# Patient Record
Sex: Female | Born: 1985 | Race: Black or African American | Hispanic: No | Marital: Single | Smoking: Never smoker
Health system: Southern US, Community
[De-identification: ages and names within clinical notes are randomized; demographics above are authoritative.]

## PROBLEM LIST (undated history)

## (undated) ENCOUNTER — Inpatient Hospital Stay (HOSPITAL_COMMUNITY): Payer: Self-pay

## (undated) DIAGNOSIS — Z789 Other specified health status: Secondary | ICD-10-CM

## (undated) HISTORY — PX: DILATION AND CURETTAGE OF UTERUS: SHX78

---

## 2011-11-20 ENCOUNTER — Encounter (HOSPITAL_COMMUNITY): Payer: Self-pay | Admitting: *Deleted

## 2011-11-20 ENCOUNTER — Inpatient Hospital Stay (HOSPITAL_COMMUNITY)
Admission: AD | Admit: 2011-11-20 | Discharge: 2011-11-20 | Disposition: A | Discharge status: Home / Self Care | Payer: Medicaid Other | Source: Ambulatory Visit | Attending: Obstetrics & Gynecology | Admitting: Obstetrics & Gynecology

## 2011-11-20 ENCOUNTER — Inpatient Hospital Stay (HOSPITAL_COMMUNITY): Payer: Medicaid Other

## 2011-11-20 DIAGNOSIS — O209 Hemorrhage in early pregnancy, unspecified: Secondary | ICD-10-CM

## 2011-11-20 HISTORY — DX: Other specified health status: Z78.9

## 2011-11-20 LAB — DIFFERENTIAL
Lymphs Abs: 3.2 10*3/uL (ref 0.7–4.0)
Monocytes Relative: 9 % (ref 3–12)
Neutro Abs: 8.1 10*3/uL — ABNORMAL HIGH (ref 1.7–7.7)
Neutrophils Relative %: 63 % (ref 43–77)

## 2011-11-20 LAB — SAMPLE TO BLOOD BANK

## 2011-11-20 LAB — CBC
Hemoglobin: 11.1 g/dL — ABNORMAL LOW (ref 12.0–15.0)
Platelets: 308 10*3/uL (ref 150–400)
RBC: 4.15 MIL/uL (ref 3.87–5.11)
WBC: 12.7 10*3/uL — ABNORMAL HIGH (ref 4.0–10.5)

## 2011-11-20 NOTE — Discharge Instructions (Signed)
Pelvic Rest Pelvic rest is sometimes recommended for women when:   The placenta is partially or completely covering the opening of the cervix (placenta previa).   There is bleeding between the uterine wall and the amniotic sac in the first trimester (subchorionic hemorrhage).   The cervix begins to open without labor starting (incompetent cervix, cervical insufficiency).   The labor is too early (preterm labor).  HOME CARE INSTRUCTIONS  Do not have sexual intercourse, stimulation, or an orgasm.   Do not use tampons, douche, or put anything in the vagina.   Do not lift anything over 10 pounds (4.5 kg).   Avoid strenuous activity or straining your pelvic muscles.  SEEK MEDICAL CARE IF:  You have any vaginal bleeding during pregnancy. Treat this as a potential emergency.   You have cramping pain felt low in the stomach (stronger than menstrual cramps).   You notice vaginal discharge (watery, mucus, or bloody).   You have a low, dull backache.   There are regular contractions or uterine tightening.  SEEK IMMEDIATE MEDICAL CARE IF: You have vaginal bleeding and have placenta previa.  Document Released: 10/20/2010 Document Revised: 06/14/2011 Document Reviewed: 10/20/2010 ExitCare Patient Information 2012 [employer].Vaginal Bleeding During Pregnancy A small amount of bleeding from the vagina can happen anytime during pregnancy. Be sure to tell your doctor about all vaginal bleeding.  HOME CARE  Get plenty of rest and sleep.   Count the number of pads you use each day. Do not use tampons.   Save any tissue you pass for your doctor to see.   Do not exercise   Do not do any heavy lifting.   Avoid going up and down stairs. If you must climb stairs, go slowly.   Do not have sex (intercourse) or orgasms until approved by your doctor.   Do not douche.   Only take medicine as told by your doctor. Do not take aspirin.   Eat healthy.   Always keep your follow-up  appointments.  GET HELP RIGHT AWAY IF:   You feel the baby moving less or not moving at all.   The bleeding gets worse.   You have very painful cramps or pain in your stomach or back.   You pass large clots or anything that looks like tissue.   You have a temperature by mouth above 102 F (38.9 C).   You feel very weak.   You have chills.   You feel dizzy or pass out (faint).   You have a gush of fluid from the vagina.  MAKE SURE YOU:   Understand these instructions.   Will watch your condition.   Will get help right away if you are not doing well or get worse.  Document Released: 04/03/2008 Document Revised: 06/14/2011 Document Reviewed: 05/31/2009 ExitCare Patient Information 2012 [employer]. 

## 2011-11-20 NOTE — MAU Provider Note (Signed)
History     CSN: [id number]  Arrival date and time: 11/20/11 0272   First Provider Initiated Contact with Patient 11/20/11 1940      Chief Complaint  Patient presents with  . Vaginal Bleeding   HPIJessica Travis is 26 y.o. G1P0 [redacted]w[redacted]d weeks presenting with sudden onset of bleeding while she was sitting at school (in [place]). She was brought in by EMS.   Denies pain.  + fetal heart tones.  Had ultrasound at her doctor's in [place] last week for spotting and told her placenta was low lying.  Last intercourse 1 week ago.  Lower back pain but denies abdominal pain.  Has had STI screening at her first OB appointment.    No past medical history on file.  No past surgical history on file.  No family history on file.  History  Substance Use Topics  . Smoking status: Not on file  . Smokeless tobacco: Not on file  . Alcohol Use: Not on file    Allergies: Allergies not on file  No prescriptions prior to admission    Review of Systems  Constitutional: Negative.   Eyes: Positive for redness.  Respiratory: Negative.   Cardiovascular: Negative.   Gastrointestinal: Negative for abdominal pain.  Genitourinary:       + vaginal bleeding.   Physical Exam   Blood pressure 127/81, pulse 104, temperature 100 F (37.8 C), temperature source Oral, resp. rate 18, height 5\' 4"  (1.626 m), weight 92.534 kg (204 lb).  Physical Exam  Constitutional: She is oriented to person, place, and time. She appears well-developed and well-nourished.  HENT:  Head: Normocephalic.  Neck: Normal range of motion.  Cardiovascular: Normal rate.   Respiratory: Effort normal.  GI: Soft. There is no tenderness. There is no rebound and no guarding.  Genitourinary: Uterus is enlarged (13 week size). Uterus is not tender. Right adnexum displays no mass, no tenderness and no fullness. Left adnexum displays no mass, no tenderness and no fullness. There is bleeding (2 large clots filling the vaginal canal,  removed.   Small amount of bleeding noted.) around the vagina. No vaginal discharge found.  Neurological: She is alert and oriented to person, place, and time.  Skin: Skin is warm and dry.  Psychiatric: She has a normal mood and affect. Her behavior is normal. Thought content normal.   Results for orders placed during the hospital encounter of 11/20/11 (from the past 24 hour(s))  CBC     Status: Abnormal   Collection Time   11/20/11  7:28 PM      Component Value Range   WBC 12.7 (*) 4.0 - 10.5 (K/uL)   RBC 4.15  3.87 - 5.11 (MIL/uL)   Hemoglobin 11.1 (*) 12.0 - 15.0 (g/dL)   HCT 53.6 (*) 64.4 - 46.0 (%)   MCV 76.1 (*) 78.0 - 100.0 (fL)   MCH 26.7  26.0 - 34.0 (pg)   MCHC 35.1  30.0 - 36.0 (g/dL)   RDW 03.4  74.2 - 59.5 (%)   Platelets 308  150 - 400 (K/uL)  DIFFERENTIAL     Status: Abnormal   Collection Time   11/20/11  7:28 PM      Component Value Range   Neutrophils Relative 63  43 - 77 (%)   Neutro Abs 8.1 (*) 1.7 - 7.7 (K/uL)   Lymphocytes Relative 25  12 - 46 (%)   Lymphs Abs 3.2  0.7 - 4.0 (K/uL)   Monocytes Relative 9  3 - 12 (%)  Monocytes Absolute 1.1 (*) 0.1 - 1.0 (K/uL)   Eosinophils Relative 2  0 - 5 (%)   Eosinophils Absolute 0.3  0.0 - 0.7 (K/uL)   Basophils Relative 0  0 - 1 (%)   Basophils Absolute 0.0  0.0 - 0.1 (K/uL)    MAU Course  Procedures  MDM 20:45  Patient is in ultrasound.  Care turned over to S. Alfonsa Vaile, CNM, FNP  [place]: 13w2 IUP, heart rate 147, CL 3.5cm, Placenta anterior,   Assessment and Plan  A:  Vaginal bleeding at [redacted]w[redacted]d gestation  P: Discharge home Pelvic Rest until re-eval by OB provider in [place] Bleeding precautions reviewed FU with OB/Gyn provider in 1-2 weeks  KEY,EVE M 11/20/2011, 7:41 PM

## 2011-11-20 NOTE — MAU Note (Signed)
Pt presents via EMS with report of onset of heavy vaginal bleeding at 1800. Pt reports she is 13 weeks preg and has had some spotting before in the preg. States u/s last week showed ? Low lying placenta.denies pain

## 2011-11-20 NOTE — MAU Provider Note (Signed)
Attestation of Attending Supervision of Advanced Practitioner: Evaluation and management procedures were performed by the OB Fellow/PA/CNM/NP under my supervision and collaboration. Chart reviewed, and agree with management and plan.  Altus Zaino, M.D. 11/20/2011 10:38 PM   

## 2011-11-20 NOTE — Progress Notes (Signed)
SSE per E. Key, NP.

## 2014-05-10 ENCOUNTER — Encounter (HOSPITAL_COMMUNITY): Payer: Self-pay | Admitting: *Deleted

## 2017-03-26 ENCOUNTER — Emergency Department (HOSPITAL_BASED_OUTPATIENT_CLINIC_OR_DEPARTMENT_OTHER)
Admission: EM | Admit: 2017-03-26 | Discharge: 2017-03-27 | Disposition: A | Discharge status: Home / Self Care | Payer: Self-pay | Attending: Emergency Medicine | Admitting: Emergency Medicine

## 2017-03-26 ENCOUNTER — Encounter (HOSPITAL_BASED_OUTPATIENT_CLINIC_OR_DEPARTMENT_OTHER): Payer: Self-pay

## 2017-03-26 DIAGNOSIS — L0291 Cutaneous abscess, unspecified: Secondary | ICD-10-CM

## 2017-03-26 DIAGNOSIS — L02415 Cutaneous abscess of right lower limb: Secondary | ICD-10-CM | POA: Insufficient documentation

## 2017-03-26 MED ORDER — LIDOCAINE HCL (PF) 2 % IJ SOLN
INTRAMUSCULAR | Status: DC
Start: 2017-03-26 — End: 2017-03-27
  Filled 2017-03-26: qty 2

## 2017-03-26 MED ORDER — LIDOCAINE HCL 1 % IJ SOLN
INTRAMUSCULAR | Status: AC
  Filled 2017-03-26: qty 20

## 2017-03-26 MED ORDER — SULFAMETHOXAZOLE-TRIMETHOPRIM 800-160 MG PO TABS: 1.0000 | ORAL_TABLET | Freq: Two times a day (BID) | ORAL | 0 refills | Status: AC

## 2017-03-26 MED ORDER — LIDOCAINE HCL (PF) 1 % IJ SOLN: 30.0000 mL | Freq: Once | INTRAMUSCULAR | Status: DC

## 2017-03-26 NOTE — ED Triage Notes (Signed)
Pt c/o right thigh abscess x 3 days-NAD-slow gait

## 2017-03-26 NOTE — ED Provider Notes (Signed)
[facility]   CSN: [id number] Arrival date & time: 03/26/17  2013     History   Chief Complaint Chief Complaint  Patient presents with  . Abscess    HPI Crystal Travis is a 31 y.o. female.   Abscess  Location:  Leg Leg abscess location:  R upper leg Abscess quality: fluctuance, induration, redness and warmth   Red streaking: no   Progression:  Worsening Chronicity:  New Ineffective treatments:  Warm compresses Associated symptoms: no fever     Past Medical History:  Diagnosis Date  . No pertinent past medical history     There are no active problems to display for this patient.   Past Surgical History:  Procedure Laterality Date  . CESAREAN SECTION    . DILATION AND CURETTAGE OF UTERUS      OB History    Gravida Para Term Preterm AB Living   3 1 0 0 1 1   SAB TAB Ectopic Multiple Live Births   1 0 0 0         Home Medications    Prior to Admission medications   Not on File    Family History No family history on file.  Social History Social History  Substance Use Topics  . Smoking status: Never Smoker  . Smokeless tobacco: Never Used  . Alcohol use Yes     Comment: occ     Allergies   Patient has no known allergies.   Review of Systems Review of Systems  Constitutional: Negative for fever.  All other systems reviewed and are negative.    Physical Exam Updated Vital Signs BP 112/70 (BP Location: Right Arm)   Pulse 90   Temp 99.5 F (37.5 C) (Oral)   Resp 16   Ht  (1.626 m)   Wt 101.2 kg (223 lb)   LMP 03/24/2017   SpO2 98%   Breastfeeding? Unknown   BMI 38.28 kg/m   Physical Exam  Constitutional: She is oriented to person, place, and time. She appears well-developed and well-nourished.  Eyes: Conjunctivae are normal.  Neck: Neck supple.  Cardiovascular: Normal rate and regular rhythm.   Pulmonary/Chest: Effort normal and breath sounds normal.  Abdominal: Soft.  Musculoskeletal:  Normal range of motion. She exhibits no edema.  Neurological: She is alert and oriented to person, place, and time.  Skin: Skin is warm and dry. There is erythema.  8X5 cm area of fluctuance and induration right inner thigh  Nursing note and vitals reviewed.    ED Treatments / Results  Labs (all labs ordered are listed, but only abnormal results are displayed) Labs Reviewed - No data to display  EKG  EKG Interpretation None       Radiology No results found.  Procedures .Marland KitchenIncision and Drainage Date/Time: 03/27/2017 12:52 AM Performed by: Katrinka Blazing, Deontay Ladnier Authorized by: Katrinka Blazing, Geneive Sandstrom   Consent:    Consent obtained:  Verbal   Consent given by:  Patient   Risks discussed:  Incomplete drainage and pain   Alternatives discussed:  Alternative treatment and observation Location:    Type:  Abscess   Size:  8 x 5 cm   Location:  Lower extremity   Lower extremity location:  Leg   Leg location:  R upper leg Pre-procedure details:    Skin preparation:  Betadine Anesthesia (see MAR for exact dosages):    Anesthesia method:  Local infiltration   Local anesthetic:  Lidocaine 1% w/o epi Procedure type:  Complexity:  Simple Procedure details:    Incision types:  Single straight   Scalpel blade:  11   Wound management:  Probed and deloculated   Drainage:  Bloody and purulent   Drainage amount:  Scant   Wound treatment:  Wound left open   Packing materials:  None Post-procedure details:    Patient tolerance of procedure:  Tolerated well, no immediate complications   (including critical care time)  Medications Ordered in ED Medications - No data to display   Initial Impression / Assessment and Plan / ED Course  I have reviewed the triage vital signs and the nursing notes.  Pertinent labs & imaging results that were available during my care of the patient were reviewed by me and considered in my medical decision making (see chart for details).     Patient with skin  abscess. Incision and drainage performed in the ED today.  Abscess was not large enough to warrant packing or drain placement. Wound recheck in 2 days. Supportive care and return precautions discussed.  Pt sent home with bactrim. The patient appears reasonably screened and/or stabilized for discharge and I doubt any other emergent medical condition requiring further screening, evaluation, or treatment in the ED prior to discharge.   Final Clinical Impressions(s) / ED Diagnoses   Final diagnoses:  Abscess    New Prescriptions Discharge Medication List as of 03/27/2017 12:06 AM    START taking these medications   Details  sulfamethoxazole-trimethoprim (BACTRIM DS,SEPTRA DS) 800-160 MG tablet Take 1 tablet by mouth 2 (two) times daily., Starting Tue 03/26/2017, Until Tue 04/02/2017, Print         Felicie Morn, NP 03/27/17 1610    Nira Conn, MD 04/01/17 [phone]

## 2020-11-19 ENCOUNTER — Emergency Department (HOSPITAL_BASED_OUTPATIENT_CLINIC_OR_DEPARTMENT_OTHER)
Admission: EM | Admit: 2020-11-19 | Discharge: 2020-11-19 | Disposition: A | Discharge status: Home / Self Care | Payer: Self-pay | Attending: Emergency Medicine | Admitting: Emergency Medicine

## 2020-11-19 ENCOUNTER — Encounter (HOSPITAL_BASED_OUTPATIENT_CLINIC_OR_DEPARTMENT_OTHER): Payer: Self-pay | Admitting: Emergency Medicine

## 2020-11-19 ENCOUNTER — Other Ambulatory Visit: Payer: Self-pay

## 2020-11-19 DIAGNOSIS — M76899 Other specified enthesopathies of unspecified lower limb, excluding foot: Secondary | ICD-10-CM

## 2020-11-19 DIAGNOSIS — M76892 Other specified enthesopathies of left lower limb, excluding foot: Secondary | ICD-10-CM | POA: Insufficient documentation

## 2020-11-19 LAB — D-DIMER, QUANTITATIVE: D-Dimer, Quant: 0.29 ug/mL-FEU (ref 0.00–0.50)

## 2020-11-19 MED ORDER — NAPROXEN 375 MG PO TABS: ORAL_TABLET | ORAL | 0 refills | Status: AC

## 2020-11-19 MED ORDER — NAPROXEN 250 MG PO TABS
500.0000 mg | ORAL_TABLET | Freq: Once | ORAL | Status: AC
  Administered 2020-11-19: 500 mg via ORAL
  Filled 2020-11-19: qty 2

## 2020-11-19 NOTE — ED Provider Notes (Signed)
[facility] Provider Note: Lowella Dell, MD, FACEP  CSN: [id number] MRN: [id number] ARRIVAL: 11/19/20 at 0017 ROOM: [facility]01/[facility]01   CHIEF COMPLAINT  Leg Pain   HISTORY OF PRESENT ILLNESS  11/19/20 12:40 AM Crystal Travis is a 35 y.o. female who has had pain in her distal quadriceps just proximal to the superior aspect of the patella.  This been present for the past 3 days.  She describes the pain as a soreness and rates it as an 8 out of 10, worse with movement or palpation.  She is not aware of injuring her left knee.  She does have a sensation of swelling at the site as well as swelling in her left foot and she is having some paresthesias of her left foot.   Past Medical History:  Diagnosis Date  . No pertinent past medical history     Past Surgical History:  Procedure Laterality Date  . CESAREAN SECTION    . DILATION AND CURETTAGE OF UTERUS      No family history on file.  Social History   Tobacco Use  . Smoking status: Never Smoker  . Smokeless tobacco: Never Used  Substance Use Topics  . Alcohol use: Yes    Comment: occ  . Drug use: No    Prior to Admission medications   Medication Sig Start Date End Date Taking? Authorizing Provider  naproxen (NAPROSYN) 375 MG tablet Take 1 tablet twice daily as needed for knee pain. 11/19/20  Yes Crisol Muecke, MD    Allergies Patient has no known allergies.   REVIEW OF SYSTEMS  Negative except as noted here or in the History of Present Illness.   PHYSICAL EXAMINATION  Initial Vital Signs Blood pressure (!) 143/97, pulse 95, temperature 98.4 F (36.9 C), temperature source Oral, resp. rate 18, height 5\' 4"  (1.626 m), weight 101.2 kg, last menstrual period 11/05/2020, SpO2 98 %, unknown if currently breastfeeding.  Examination General: Well-developed, well-nourished female in no acute distress; appearance consistent with age of record HENT: normocephalic; atraumatic Eyes: Normal appearance Neck:  supple Heart: regular rate and rhythm Lungs: clear to auscultation bilaterally Abdomen: soft; nondistended; nontender; bowel sounds present Extremities: No deformity; tenderness of the distal left quadriceps tendon near insertion on patella without appreciable swelling at this site or of left calf or foot; pulses normal Neurologic: Awake, alert and oriented; motor function intact in all extremities and symmetric; no facial droop Skin: Warm and dry Psychiatric: Normal mood and affect   RESULTS  Summary of this visit's results, reviewed and interpreted by myself:   EKG Interpretation  Date/Time:    Ventricular Rate:    PR Interval:    QRS Duration:   QT Interval:    QTC Calculation:   R Axis:     Text Interpretation:        Laboratory Studies: Results for orders placed or performed during the hospital encounter of 11/19/20 (from the past 24 hour(s))  D-dimer, quantitative     Status: None   Collection Time: 11/19/20  1:19 AM  Result Value Ref Range   D-Dimer, Quant 0.29 0.00 - 0.50 ug/mL-FEU   Imaging Studies: No results found.  ED COURSE and MDM  Nursing notes, initial and subsequent vitals signs, including pulse oximetry, reviewed and interpreted by myself.  Vitals:   11/19/20 0028 11/19/20 0034  BP:  (!) 143/97  Pulse:  95  Resp:  18  Temp:  98.4 F (36.9 C)  TempSrc:  Oral  SpO2:  98%  Weight: 101.2 kg   Height: 5\' 4"  (1.626 m)    Medications  naproxen (NAPROSYN) tablet 500 mg (500 mg Oral Given 11/19/20 0128)   2:10 AM D-dimer is normal.  I suspect the patient has quadriceps tendinitis.  We will place in a knee brace and started on an NSAID.  Paresthesias in the left foot may be due to inflammation affecting the common peroneal nerve as it courses around the fibular head.    PROCEDURES  Procedures   ED DIAGNOSES     ICD-10-CM   1. Quadriceps tendonitis  M76.899        Wyatt Thorstenson, 11/21/20, MD 11/19/20 [phone]

## 2020-11-19 NOTE — ED Triage Notes (Signed)
Patient presents with complaints of left knee and calf pain and tingling in left foot; states onset 3 days ago; denies any known trauma.

## 2021-07-03 ENCOUNTER — Other Ambulatory Visit: Payer: Self-pay

## 2021-07-03 ENCOUNTER — Emergency Department (HOSPITAL_BASED_OUTPATIENT_CLINIC_OR_DEPARTMENT_OTHER)
Admission: EM | Admit: 2021-07-03 | Discharge: 2021-07-03 | Disposition: A | Discharge status: Home / Self Care | Payer: Medicaid Other | Attending: Emergency Medicine | Admitting: Emergency Medicine

## 2021-07-03 ENCOUNTER — Encounter (HOSPITAL_BASED_OUTPATIENT_CLINIC_OR_DEPARTMENT_OTHER): Payer: Self-pay | Admitting: Emergency Medicine

## 2021-07-03 DIAGNOSIS — J101 Influenza due to other identified influenza virus with other respiratory manifestations: Secondary | ICD-10-CM | POA: Insufficient documentation

## 2021-07-03 DIAGNOSIS — Z20822 Contact with and (suspected) exposure to covid-19: Secondary | ICD-10-CM | POA: Insufficient documentation

## 2021-07-03 DIAGNOSIS — R000 Tachycardia, unspecified: Secondary | ICD-10-CM | POA: Insufficient documentation

## 2021-07-03 LAB — RESP PANEL BY RT-PCR (FLU A&B, COVID) ARPGX2
Influenza A by PCR: POSITIVE — AB
Influenza B by PCR: NEGATIVE
SARS Coronavirus 2 by RT PCR: NEGATIVE

## 2021-07-03 MED ORDER — BENZONATATE 100 MG PO CAPS
100.0000 mg | ORAL_CAPSULE | Freq: Once | ORAL | Status: AC
  Administered 2021-07-03: 11:00:00 100 mg via ORAL
  Filled 2021-07-03: qty 1

## 2021-07-03 MED ORDER — ACETAMINOPHEN 500 MG PO TABS
1000.0000 mg | ORAL_TABLET | Freq: Once | ORAL | Status: AC
  Administered 2021-07-03: 11:00:00 1000 mg via ORAL
  Filled 2021-07-03: qty 2

## 2021-07-03 MED ORDER — BENZONATATE 100 MG PO CAPS: 100.0000 mg | ORAL_CAPSULE | Freq: Three times a day (TID) | ORAL | 0 refills | Status: AC

## 2021-07-03 NOTE — Discharge Instructions (Addendum)
You have the flu.  Continue taking Tylenol and Motrin as needed for body aches.  Use the Tessalon Perles every 8 hours as needed for cough.  Take the next 2 days off work to recover.  Continue drinking fluids at home.

## 2021-07-03 NOTE — ED Triage Notes (Signed)
Reports flu symptoms for the last 4 days.  Was exposed to the flu at work.

## 2021-07-03 NOTE — ED Provider Notes (Signed)
[facility] EMERGENCY DEPARTMENT Provider Note   CSN: [id number] Arrival date & time: 07/03/21  0940     History No chief complaint on file.   Crystal Travis is a 35 y.o. female.  HPI  Patient Heparin medical history presents due to fever and chills.  The started about 4 days ago acutely, they are intermittent symptoms.  They are also associated symptoms of cough, nasal congestion, headaches.  Warm tea and Tylenol have helped alleviate the headaches and the sore throat.  It is a dry cough, has not taken anything for the cough yet.  She does feel some chest tightness when she coughs but no chest pain at rest.  Does not feel short of breath, no history of asthma.  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  Past Medical History:  Diagnosis Date   No pertinent past medical history     There are no problems to display for this patient.   Past Surgical History:  Procedure Laterality Date   CESAREAN SECTION     DILATION AND CURETTAGE OF UTERUS       OB History     Gravida  3   Para  1   Term  0   Preterm  0   AB  1   Living  1      SAB  1   IAB  0   Ectopic  0   Multiple  0   Live Births              No family history on file.  Social History   Tobacco Use   Smoking status: Never   Smokeless tobacco: Never  Substance Use Topics   Alcohol use: Yes    Comment: occ   Drug use: No    Home Medications Prior to Admission medications   Medication Sig Start Date End Date Taking? Authorizing Provider  naproxen (NAPROSYN) 375 MG tablet Take 1 tablet twice daily as needed for knee pain. 11/19/20   Molpus, Jonny Ruiz, MD    Allergies    Patient has no known allergies.  Review of Systems   Review of Systems  Constitutional:  Positive for chills and fever.  HENT:  Positive for congestion and sore throat.   Respiratory:  Positive for cough. Negative for shortness of breath.   Cardiovascular:   Negative for chest pain.  Musculoskeletal:  Positive for myalgias.  Neurological:  Positive for headaches.   Physical Exam Updated Vital Signs BP (!) 133/93 (BP Location: Right Arm)    Pulse (!) 112    Temp 99.6 F (37.6 C)    Resp 18    Ht 5\' 4"  (1.626 m)    Wt 102.1 kg    LMP 06/05/2021    SpO2 99%    BMI 38.62 kg/m   Physical Exam Vitals and nursing note reviewed. Exam conducted with a chaperone present.  Constitutional:      Appearance: Normal appearance.  HENT:     Head: Normocephalic.     Nose: Congestion present.     Mouth/Throat:     Pharynx: No posterior oropharyngeal erythema.  Eyes:     Extraocular Movements: Extraocular movements intact.     Pupils: Pupils are equal, round, and reactive to light.  Cardiovascular:     Rate and Rhythm: Regular rhythm. Tachycardia present.  Pulmonary:     Effort: Pulmonary effort is normal.     Breath sounds: Normal  breath sounds.     Comments: Lungs CTA bilaterally. No accessory muscle use. Speaking in complete sentences.  Abdominal:     General: Abdomen is flat.     Palpations: Abdomen is soft.  Musculoskeletal:     Cervical back: Normal range of motion.  Neurological:     Mental Status: She is alert.  Psychiatric:        Mood and Affect: Mood normal.   ED Results / Procedures / Treatments   Labs (all labs ordered are listed, but only abnormal results are displayed) Labs Reviewed  RESP PANEL BY RT-PCR (FLU A&B, COVID) [id number]    EKG None  Radiology No results found.  Procedures Procedures   Medications Ordered in ED Medications - No data to display  ED Course  I have reviewed the triage vital signs and the nursing notes.  Pertinent labs & imaging results that were available during my care of the patient were reviewed by me and considered in my medical decision making (see chart for details).    MDM Rules/Calculators/A&P                         This is a 35 year old female presenting due to flulike symptoms.   These have been present for 4 days, she is slightly tachycardic on exam likely secondary to elevated temperature.  She not technically febrile, not having any chest pain other than with coughing.  Doubt any cardiac involvement, suspect viral URI.  We will start with respiratory panel and continue to evaluate.  We will also give benzonatate and Tylenol for her symptoms in the interim.  There is no tachypnea or hypoxia, lungs are clear to auscultation so I do not feel at this time CXR is needed.  Patient is flu positive.  Patient discharged in stable condition.     Final Clinical Impression(s) / ED Diagnoses Final diagnoses:  None    Rx / DC Orders ED Discharge Orders     None        Theron Arista, Cordelia Poche 07/03/21 1950    Cheryll Cockayne, MD 07/06/21 2332
